# Patient Record
Sex: Female | Born: 1975 | Race: White | Hispanic: No | Marital: Married | Smoking: Current every day smoker
Health system: Southern US, Community
[De-identification: ages and names within clinical notes are randomized; demographics above are authoritative.]

## PROBLEM LIST (undated history)

## (undated) DIAGNOSIS — E785 Hyperlipidemia, unspecified: Secondary | ICD-10-CM

## (undated) HISTORY — DX: Hyperlipidemia, unspecified: E78.5

## (undated) HISTORY — PX: HAND SURGERY: SHX662

---

## 1998-01-31 ENCOUNTER — Inpatient Hospital Stay (HOSPITAL_COMMUNITY): Admission: AD | Admit: 1998-01-31 | Discharge: 1998-01-31 | Payer: Self-pay | Admitting: Obstetrics and Gynecology

## 1998-03-07 ENCOUNTER — Inpatient Hospital Stay (HOSPITAL_COMMUNITY): Admission: AD | Admit: 1998-03-07 | Discharge: 1998-03-10 | Payer: Self-pay | Admitting: Obstetrics and Gynecology

## 1999-06-15 ENCOUNTER — Other Ambulatory Visit: Admission: RE | Admit: 1999-06-15 | Discharge: 1999-06-15 | Payer: Self-pay | Admitting: Obstetrics and Gynecology

## 2000-07-23 ENCOUNTER — Other Ambulatory Visit: Admission: RE | Admit: 2000-07-23 | Discharge: 2000-07-23 | Payer: Self-pay | Admitting: Obstetrics and Gynecology

## 2002-10-16 ENCOUNTER — Other Ambulatory Visit: Admission: RE | Admit: 2002-10-16 | Discharge: 2002-10-16 | Payer: Self-pay | Admitting: Obstetrics and Gynecology

## 2004-03-16 ENCOUNTER — Other Ambulatory Visit: Admission: RE | Admit: 2004-03-16 | Discharge: 2004-03-16 | Payer: Self-pay | Admitting: Obstetrics and Gynecology

## 2004-09-12 ENCOUNTER — Other Ambulatory Visit: Admission: RE | Admit: 2004-09-12 | Discharge: 2004-09-12 | Payer: Self-pay | Admitting: Obstetrics and Gynecology

## 2005-08-29 ENCOUNTER — Other Ambulatory Visit: Admission: RE | Admit: 2005-08-29 | Discharge: 2005-08-29 | Payer: Self-pay | Admitting: Obstetrics and Gynecology

## 2007-04-30 ENCOUNTER — Ambulatory Visit: Payer: Self-pay | Admitting: Internal Medicine

## 2008-12-14 ENCOUNTER — Inpatient Hospital Stay (HOSPITAL_COMMUNITY): Admission: AD | Admit: 2008-12-14 | Discharge: 2008-12-16 | Payer: Self-pay | Admitting: Obstetrics and Gynecology

## 2008-12-15 ENCOUNTER — Encounter (INDEPENDENT_AMBULATORY_CARE_PROVIDER_SITE_OTHER): Payer: Self-pay | Admitting: Obstetrics and Gynecology

## 2009-06-28 ENCOUNTER — Ambulatory Visit: Payer: Self-pay

## 2010-09-18 LAB — COMPREHENSIVE METABOLIC PANEL
ALT: 26 U/L (ref 0–35)
AST: 28 U/L (ref 0–37)
CO2: 26 mEq/L (ref 19–32)
Chloride: 108 mEq/L (ref 96–112)
Creatinine, Ser: 0.67 mg/dL (ref 0.4–1.2)
GFR calc Af Amer: >60 mL/min (ref >60)
GFR calc non Af Amer: >60 mL/min (ref >60)
Glucose, Bld: 66 mg/dL — ABNORMAL LOW (ref 70–99)
Total Bilirubin: 0.4 mg/dL (ref 0.3–1.2)

## 2010-09-18 LAB — CBC
HCT: 38 % (ref 36.0–46.0)
Hemoglobin: 11.6 g/dL — ABNORMAL LOW (ref 12.0–15.0)
Hemoglobin: 13.3 g/dL (ref 12.0–15.0)
MCHC: 35 g/dL (ref 30.0–36.0)
MCV: 90.7 fL (ref 78.0–100.0)
RBC: 3.66 MIL/uL — ABNORMAL LOW (ref 3.87–5.11)
RBC: 4.2 MIL/uL (ref 3.87–5.11)
RDW: 14.3 % (ref 11.5–15.5)
WBC: 10.1 10*3/uL (ref 4.0–10.5)
WBC: 9.7 10*3/uL (ref 4.0–10.5)

## 2010-09-18 LAB — RH IMMUNE GLOB WKUP(>/=20WKS)(NOT WOMEN'S HOSP)

## 2011-01-26 ENCOUNTER — Ambulatory Visit: Payer: Self-pay | Admitting: Specialist

## 2011-01-26 DIAGNOSIS — I10 Essential (primary) hypertension: Secondary | ICD-10-CM

## 2012-10-28 LAB — CBC
HCT: 35.9 % (ref 35.0–47.0)
HGB: 12.2 g/dL (ref 12.0–16.0)
MCH: 30 pg (ref 26.0–34.0)
MCV: 88 fL (ref 80–100)
Platelet: 262 10*3/uL (ref 150–440)
RDW: 12.4 % (ref 11.5–14.5)
WBC: 8.2 10*3/uL (ref 3.6–11.0)

## 2012-10-28 LAB — COMPREHENSIVE METABOLIC PANEL
Anion Gap: 13 (ref 7–16)
BUN: 13 mg/dL (ref 7–18)
Calcium, Total: 9.4 mg/dL (ref 8.5–10.1)
EGFR (Non-African Amer.): >60
Potassium: 2.9 mmol/L — ABNORMAL LOW (ref 3.5–5.1)
SGPT (ALT): 27 U/L (ref 12–78)

## 2012-10-28 LAB — URINALYSIS, COMPLETE
Bilirubin,UR: NEGATIVE
Ph: 6 (ref 4.5–8.0)
RBC,UR: NONE SEEN /HPF (ref 0–5)
Squamous Epithelial: 3

## 2012-10-28 LAB — DRUG SCREEN, URINE
Amphetamines, Ur Screen: NEGATIVE (ref ?–1000)
MDMA (Ecstasy)Ur Screen: NEGATIVE (ref ?–500)
Opiate, Ur Screen: NEGATIVE (ref ?–300)
Phencyclidine (PCP) Ur S: NEGATIVE (ref ?–25)
Tricyclic, Ur Screen: NEGATIVE (ref ?–1000)

## 2012-10-28 LAB — ACETAMINOPHEN LEVEL: Acetaminophen: <2 ug/mL

## 2012-10-28 LAB — SALICYLATE LEVEL: Salicylates, Serum: 5.6 mg/dL — ABNORMAL HIGH

## 2012-10-29 ENCOUNTER — Inpatient Hospital Stay: Payer: Self-pay | Admitting: Psychiatry

## 2012-10-29 LAB — URINALYSIS, COMPLETE
Bilirubin,UR: NEGATIVE
Glucose,UR: NEGATIVE mg/dL (ref 0–75)
Nitrite: NEGATIVE
Ph: 5 (ref 4.5–8.0)
Protein: 30
RBC,UR: 15 /HPF (ref 0–5)
Specific Gravity: 1.029 (ref 1.003–1.030)
Squamous Epithelial: 52
WBC UR: 24 /HPF (ref 0–5)

## 2012-10-30 LAB — BEHAVIORAL MEDICINE 1 PANEL
Albumin: 3.1 g/dL — ABNORMAL LOW (ref 3.4–5.0)
Alkaline Phosphatase: 59 U/L (ref 50–136)
Anion Gap: 6 — ABNORMAL LOW (ref 7–16)
BUN: 11 mg/dL (ref 7–18)
Basophil #: 0.1 10*3/uL (ref 0.0–0.1)
Basophil %: 0.8 %
Bilirubin,Total: 0.3 mg/dL (ref 0.2–1.0)
Calcium, Total: 8.8 mg/dL (ref 8.5–10.1)
Chloride: 106 mmol/L (ref 98–107)
Co2: 25 mmol/L (ref 21–32)
Creatinine: 0.63 mg/dL (ref 0.60–1.30)
EGFR (African American): >60
EGFR (Non-African Amer.): >60
Eosinophil #: 0.1 10*3/uL (ref 0.0–0.7)
Eosinophil %: 1.8 %
Glucose: 88 mg/dL (ref 65–99)
HCT: 36 % (ref 35.0–47.0)
HGB: 12.1 g/dL (ref 12.0–16.0)
Lymphocyte #: 2 10*3/uL (ref 1.0–3.6)
Lymphocyte %: 24.5 %
MCH: 29.8 pg (ref 26.0–34.0)
MCHC: 33.6 g/dL (ref 32.0–36.0)
MCV: 89 fL (ref 80–100)
Monocyte #: 0.5 x10 3/mm (ref 0.2–0.9)
Monocyte %: 6.5 %
Neutrophil #: 5.3 10*3/uL (ref 1.4–6.5)
Neutrophil %: 66.4 %
Osmolality: 273 (ref 275–301)
Platelet: 259 10*3/uL (ref 150–440)
Potassium: 3.8 mmol/L (ref 3.5–5.1)
RBC: 4.05 10*6/uL (ref 3.80–5.20)
RDW: 12.3 % (ref 11.5–14.5)
SGOT(AST): 25 U/L (ref 15–37)
SGPT (ALT): 20 U/L (ref 12–78)
Sodium: 137 mmol/L (ref 136–145)
Thyroid Stimulating Horm: 4.23 u[IU]/mL
Total Protein: 6.4 g/dL (ref 6.4–8.2)
WBC: 8 10*3/uL (ref 3.6–11.0)

## 2012-10-31 LAB — TSH: Thyroid Stimulating Horm: 6.25 u[IU]/mL — ABNORMAL HIGH

## 2014-10-02 NOTE — H&P (Signed)
PATIENT NAME:  Carolyn Schroeder, Carolyn Schroeder MR#:  [id number] DATE OF BIRTH:  [birth year]  DATE OF ADMISSION:  10/29/2012  IDENTIFYING INFORMATION AND CHIEF COMPLAINT: A 39 year old woman with a history of bipolar disorder who was admitted to the hospital after taking an overdose of Seroquel.   CHIEF COMPLAINT: "I took too many pills."   HISTORY OF PRESENT ILLNESS: The patient reports on the night presentation, she got into a loud argument with her husband. They had been arguing for the last couple of weeks because he had discovered her making phone calls or text messages to another man. The patient admits that this was an inappropriate relationship, although she denies that she was actually having an affair with the person. In any case, she and her husband and squabbling about it ever since then and she says that she just got sick of it and took 6 of her 400 mg Seroquel tablets. She did it impulsively and immediately afterwards called 911 and cooperated with treatment. She says she does not think she ever really want to die but just wanted "to get attention." She admits that she had been drinking at the time. Denies that she had been abusing other drugs. She says that prior to that, she had not been having particular problems with depression. Sleep is good. Appetite is good and not having any hallucinations. Not having any signs of mania.   PAST PSYCHIATRIC HISTORY: She sees Dr. Emerson MonteParrish McKinney in [place] and has been seeing her for about three years. That is her only psychiatric treatment. She is currently taking Seroquel 400 mg at night. Not taking any other antipsychotic or antidepressant medicines. She says that she has been diagnosed with bipolar. She does not describe a full type I, manic episode. She thinks that Seroquel has been somewhat helpful for her. Never been in a psychiatric hospitalization. Denies any history of suicide attempts or self-injury or violent behavior. Denies any psychotic history.    SUBSTANCE ABUSE HISTORY: She says that she drinks about three nights a week, and will not tell me, despite my asking several times, how much she drinks on a typical night. She claims that the night she came in here had been an unusually larger amount of drinking for her. She does not think that drinking is caused her any problems. She claims that no one else in the upper suggested that either. Denies that she uses any other substances.   SOCIAL HISTORY: Lives with her husband and two children, one age 37, one much younger a toddler.  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 She and her husband been married for about 15 years.   PAST MEDICAL HISTORY: She says she has high blood pressure and takes hydrochlorothiazide and a potassium supplement. Does not know of any other ongoing medical problems.   MEDICATIONS ON ADMISSION: Seroquel 400 mg at night, hydrochlorothiazide 25 mg in the morning, potassium chloride 20 mEq per day.   ALLERGIES: PENICILLIN.   REVIEW OF SYSTEMS: As noted above, depressed mood, only last night. Not having consistently depressed mood. Denies suicidal or homicidal ideation. Denies hallucinations. Denies psychotic symptoms. Denies any current physical symptoms or complaints.   MENTAL STATUS EXAMINATION: Casually dressed woman, looks her stated age or younger. Cooperative with the interview, although somewhat passive and very fixed on wanting to be discharged. Eye contact, normal. Psychomotor activity a little sluggish. Speech decreased in total amount. Part of this may be just evading questions.  Easy to understand when she does speak. Affect seems a little bit blunted. Mood stated as being bad because she is in the hospital. Thoughts appear to be lucid with no evidence of loosening of associations or delusions. Denies auditory or visual hallucinations. Denies suicidal or homicidal ideation. Shows some impairment in her insight  regarding substance abuse recent impairment in judgment. Intelligence normal.   PHYSICAL EXAMINATION: GENERAL: Overweight woman does not appear to be in any acute distress. Her skin flushes very easily but there are no acute lesions identified. Both eyes are bloodshot, but otherwise unremarkable.  HEENT: Pupils equal and reactive. Face otherwise symmetric. Oral mucosa dry. Neck normal flexion. Back nontender. Strength and reflexes normal and symmetric throughout. Cranial nerves symmetric and normal.  LUNGS: Clear without any wheezes.  HEART: Regular rate and rhythm.  ABDOMEN: Soft, nontender, normal bowel sounds.  NEUROLOGIC:  Gait within normal limits.  VITAL SIGNS: Temperature 98.3, pulse 80, respirations 20, blood pressure 157/89.   LABORATORY RESULTS: She had an alcohol level of 243 on presentation. She had sinus tachycardia. Chemistry showed an elevated glucose and a low potassium at 2.9. TSH elevated at 7.2. Drug screen negative. Urinalysis probably positive now for an infection. CBC normal.   ASSESSMENT: A 39 year old woman with an acute episode of self-injuring behavior, but denies ongoing suicidality. She has been treated for bipolar disorder in the past, but vague in describing symptoms of it. She presents herself as not really being interested in getting treatment here and trying to minimize the seriousness of the situation. Currently, she is denying any psychotic or mood symptoms. The patient clearly has episodes of drinking excessively. It is not clear if she has alcohol dependence. May have a thyroid problem. Does have hypertension.   TREATMENT PLAN: I am going to restart the Seroquel, also her blood pressure medicine and potassium supplement. Treat the urinary tract infection. Engage her in groups and activities on the ward. Try and engage her mood and behavior. Do some more psychoeducation about her condition and med use. Psychoeducation about substance abuse.   DIAGNOSIS, PRINCIPAL  AND PRIMARY:  AXIS I: Bipolar disorder, not otherwise specified.   SECONDARY DIAGNOSES: AXIS I: Alcohol abuse.   AXIS II: Deferred.   AXIS III: Obesity, rule out hypothyroidism, positive hypertension.   AXIS IV: Severe from acute argument with her husband.   AXIS V: Functioning at time of evaluation 42.    ____________________________ Audery Amel, MD jtc:cc D: 10/30/2012 16:02:00 ET T: 10/30/2012 16:25:33 ET JOB#: [id number]  cc: Audery Amel, MD, <Dictator> Audery Amel MD ELECTRONICALLY SIGNED 10/31/2012 11:04

## 2014-10-02 NOTE — Consult Note (Signed)
Brief Consult Note: Diagnosis: Bipolar DO, Alcohol Abuse.   Patient was seen by consultant.   Consult note dictated.   Recommend further assessment or treatment.   Orders entered.   Comments: Pt seen in ED. She is s/p Seroquel over dose and appeared very drowsy and has some facial edema. She stated that she was fighting with her husband as he recently found out about her extra marital affair for the past year. She is not feeling guilty about the same. Has h/o Bipolar and takes medications.   Plan; She is IVC.  Case discussed with Dr Margarita GrizzleWoodruff and once she is medically stable, [facility]  Start Lorazepam 1mg  q6 hrs. Thiamine 100mg  poq daily and Folate 1 mg po daily.  Will continue to monitor.  Electronic Signatures: Rhunette CroftFaheem, Madiline Saffran S (MD)  (Signed 20-May-14 13:25)  Authored: Brief Consult Note   Last Updated: 20-May-14 13:25 by Rhunette CroftFaheem, Abrar Bilton S (MD)

## 2014-10-02 NOTE — Discharge Summary (Signed)
PATIENT NAME:  Carolyn Schroeder, Pluma L MR#:  [id number] DATE OF BIRTH:  [birth year]  DATE OF ADMISSION:  10/29/2012 DATE OF DISCHARGE:   10/31/2012  HOSPITAL COURSE: See dictated history and physical for details of admission. This 39 year old woman was brought to the Emergency Room after taking an impulsive overdose of Seroquel. The overdose itself was nonfatal and has passed without any adverse effects. The patient was intoxicated at the time of admission. She is now sober and not showing any signs of alcohol withdrawal. The patient appears to have more of an alcohol use problem than she had realized. She has been counseled about the importance of staying sober, as well as staying involved in her mental health treatment. She consistently denies any suicidal ideation in the hospital and has not shown any further dangerous behavior. She shows adequate insight and judgment and already has outpatient treatment in place. From a medical standpoint, one issue that arose was the discovery that she had a urinary tract infection, which has been treated. Also of note was an elevation of her TSH. Repeat testing still shows an elevation of the TSH, but not as bad as before. The patient has been educated about the importance of following up with an outpatient provider about her thyroid.   DISCHARGE MEDICATIONS: Oral birth control pill once a day, quetiapine 400 mg at night, hydrochlorothiazide 25 mg once a day, potassium chloride 20 mEq a day, Septra DS once every 12 hours for another 3 days.   LABORATORY RESULTS: Admission labs showed drug screen that was negative, TSH elevated at 7.2. Alcohol 243. Potassium low at 2.9, glucose elevated at 131, CO2 low at 20. CBC unremarkable. Urinalysis positive for signs of infection. Followup urinalysis the next day was actually even more positive. Followup chemistries most remarkable for showing not only an improvement in her potassium, but her TSH to be still slightly elevated at 6.25.    MENTAL STATUS EXAMINATION AT DISCHARGE: Neatly dressed and groomed woman, looks her stated age, cooperative with the interview. Good eye contact, normal psychomotor activity. Speech normal in rate, tone and volume. Affect euthymic, reactive and appropriate. Mood stated as being good. Thoughts are lucid. No evidence of loosening of associations or delusions. Denies auditory or visual hallucinations. Shows improved insight and judgment. Normal intelligence. Agreeable to outpatient treatment plan.   DISPOSITION: Discharged back home. She states she feels safe and comfortable returning home with her husband and children. Follow up with Dr. Andee PolesParish McKinney in [place].   DIAGNOSIS, PRINCIPAL AND PRIMARY:  AXIS I: Bipolar disorder type II.   SECONDARY DIAGNOSES: AXIS I: Alcohol abuse versus dependence.  AXIS II: Deferred.  AXIS III: Urinary tract infection, rule out hypothyroidism.  AXIS IV: Moderate to severe stress from conflict at home.  AXIS V: Functioning at time of discharge 57.   ____________________________ Audery AmelJohn T. Clapacs, MD jtc:dmm D: 10/31/2012 12:31:47 ET T: 10/31/2012 12:52:03 ET JOB#: [id number]  cc: Audery AmelJohn T. Clapacs, MD, <Dictator> Audery AmelJOHN T CLAPACS MD ELECTRONICALLY SIGNED 11/01/2012 19:19

## 2014-10-02 NOTE — Consult Note (Signed)
PATIENT NAME:  Carolyn Schroeder, Carolyn Schroeder MR#:  [id number] DATE OF BIRTH:  1976/04/08  DATE OF CONSULTATION:  10/29/2012  REFERRING PHYSICIAN:  Pollie Friar, MD   CONSULTING PHYSICIAN:  Cordelia Pen. Gretel Acre, MD  REASON FOR CONSULT: Overdose on Seroquel.   HISTORY OF PRESENT ILLNESS: The patient is a 39 year old married Caucasian female who  presented to the ED by the police after she took an overdose of 400 mg tablets of Seroquel in a suicide attempt. "I want to die." According to the initial notes, the patient was having a conflict with her husband after her husband recently found out that she was having an extramarital affair for the past 1 year. Her husband was out of town, and her daughter found out from her text message that she was talking to somebody else over the phone. The patient reported that she met him at the grocery store, and she has been going back and forth to the grocery store to see that person. She reported that she has not had any sexual contact with him. The patient stated that her daughter told her husband about the text message, and after he came back from his visit out of town he confronted her. They started fighting, and then she decided to take an overdose of the medication. She stated that she took all the pills. The patient reported that she has been feeling depressed and sad and started having mood swings. The patient was prescribed medication by her psychiatrist with whom she has been following on a regular basis. The patient was very sedated during the interview and was noted to have some facial edema as well. She reported that she is having a conflictual relationship with her husband at this time. Her husband and minister were present at the initial assessment at the ED.  The patient at this time contracted for safety.   PAST PSYCHIATRIC HISTORY: The patient reported that she was diagnosed with bipolar disorder 3 years ago by Dr. Caprice Beaver. She has been following him on a regular basis,  and he has prescribed her Seroquel 400 mg at bedtime. She continues to have mood swings. She reported that this is her first suicide attempt. She has never been hospitalized in the past.   FAMILY HISTORY: The patient reported that she has a family history of bipolar in her mother, who has also attempted suicide in the past.   CURRENT MEDICATIONS: Quetiapine 400 mg at bedtime, hydrochlorothiazide 25 mg daily, Klor-Con 20 mEq extended release 1 tablet daily  ALLERGIES: PENICILLIN.    PAST MEDICAL HISTORY: Hypertension, a recent cold.   PAST SURGICAL HISTORY: None.   SOCIAL HISTORY: The patient reported that she is currently married for the past 15 years. She has 2 children, a 40 year old daughter and a 52-year-old old son.  Her husband works at a [employer]. The patient stated that she has been seeing the other person for the past 1 year. She currently denied any pending legal charges.   SUBSTANCE ABUSE HISTORY: The patient reported that she drinks every now and then. She smokes 1 pack of cigarettes per day. She denied any use of drugs, including cocaine and marijuana, at this time.   VITAL SIGNS: Temperature not noted, pulse 106, respirations 18, blood pressure 120/66.   LABORATORY DATA: Glucose 131, BUN 13, creatinine 0.64, sodium 138, potassium 2.9, chloride 105, bicarbonate 20, GFR 60, anion gap 13, osmolality 278, calcium 9.4. Blood alcohol level 243.  Protein 7.4, albumin 3.8, bilirubin 0.2, alkaline phosphatase 63,  AST 27, ALT 27, TSH 7.2. Urine drug screen negative. WBC 8.2, RBC 4.06, hemoglobin 12.2, hematocrit 35.9, platelet count 262, MCV 88, RDW 12.4. Pregnancy test was negative.   MENTAL STATUS EXAMINATION: The patient is a moderately-built female who appeared very sedated during the interview. She has some facial swelling also. She maintained poor eye contact. Her mood was depressed.  Affect was congruent. Thought process was logical, goal-directed. Thought content was non  delusional. She was unable to keep her eyes open at this time.  She was unable to contract for safety as well. She demonstrated poor insight and judgment.   DIAGNOSTIC IMPRESSION: AXIS I: 1.  Bipolar disorder, most recent episode depressed.  2.  Alcohol abuse.  AXIS II: Personality disorder, not otherwise specified.  AXIS III: Hypertension.   TREATMENT PLAN: 1.  The patient is currently under involuntary commitment, and she is not medically stable at this time.  2.  Once she becomes medically stable, she will be admitted to the behavioral health inpatient unit.  The patient will be given lorazepam 1 mg p.o. q. 6 hours on a regular basis to help with alcohol withdrawal.  3.  She will also be given thiamine 100 mg p.o. daily.  4.  She will also be given folic acid 1 mg p.o. daily.  I discussed her care with the behavioral health staff, and they demonstrated understanding. The patient will be monitored closely in the ED at this time.   Thank you for allowing me to participate in the care of this patient.   ____________________________ Cordelia Pen. Gretel Acre, MD usf:cb D: 10/29/2012 13:50:39 ET T: 10/29/2012 14:16:09 ET JOB#: [id number]  cc: Cordelia Pen. Gretel Acre, MD, <Dictator> Jeronimo Norma MD ELECTRONICALLY SIGNED 10/31/2012 10:50

## 2018-06-22 ENCOUNTER — Inpatient Hospital Stay
Admission: EM | Admit: 2018-06-22 | Discharge: 2018-06-24 | DRG: 558 | Disposition: A | Discharge status: Home / Self Care | Payer: BLUE CROSS/BLUE SHIELD | Attending: Orthopedic Surgery | Admitting: Orthopedic Surgery

## 2018-06-22 ENCOUNTER — Other Ambulatory Visit: Payer: Self-pay

## 2018-06-22 ENCOUNTER — Emergency Department: Payer: BLUE CROSS/BLUE SHIELD

## 2018-06-22 ENCOUNTER — Encounter: Payer: Self-pay | Admitting: Emergency Medicine

## 2018-06-22 DIAGNOSIS — M659 Synovitis and tenosynovitis, unspecified: Secondary | ICD-10-CM | POA: Diagnosis present

## 2018-06-22 DIAGNOSIS — M65842 Other synovitis and tenosynovitis, left hand: Principal | ICD-10-CM | POA: Diagnosis present

## 2018-06-22 DIAGNOSIS — Z880 Allergy status to penicillin: Secondary | ICD-10-CM | POA: Diagnosis not present

## 2018-06-22 DIAGNOSIS — F17290 Nicotine dependence, other tobacco product, uncomplicated: Secondary | ICD-10-CM | POA: Diagnosis present

## 2018-06-22 DIAGNOSIS — I10 Essential (primary) hypertension: Secondary | ICD-10-CM | POA: Diagnosis present

## 2018-06-22 LAB — BASIC METABOLIC PANEL
ANION GAP: 7 (ref 5–15)
BUN: 12 mg/dL (ref 6–20)
CO2: 27 mmol/L (ref 22–32)
Calcium: 8.9 mg/dL (ref 8.9–10.3)
Chloride: 105 mmol/L (ref 98–111)
Creatinine, Ser: 0.6 mg/dL (ref 0.44–1.00)
GFR calc Af Amer: >60 mL/min (ref >60)
GFR calc non Af Amer: >60 mL/min (ref >60)
GLUCOSE: 94 mg/dL (ref 70–99)
POTASSIUM: 4 mmol/L (ref 3.5–5.1)
Sodium: 139 mmol/L (ref 135–145)

## 2018-06-22 LAB — CBC WITH DIFFERENTIAL/PLATELET
ABS IMMATURE GRANULOCYTES: 0.06 10*3/uL (ref 0.00–0.07)
BASOS PCT: 1 %
Basophils Absolute: 0.1 10*3/uL (ref 0.0–0.1)
Eosinophils Absolute: 0.3 10*3/uL (ref 0.0–0.5)
Eosinophils Relative: 2 %
HCT: 41.6 % (ref 36.0–46.0)
Hemoglobin: 13.4 g/dL (ref 12.0–15.0)
Immature Granulocytes: 0 %
Lymphocytes Relative: 24 %
Lymphs Abs: 3.4 10*3/uL (ref 0.7–4.0)
MCH: 29.6 pg (ref 26.0–34.0)
MCHC: 32.2 g/dL (ref 30.0–36.0)
MCV: 92 fL (ref 80.0–100.0)
MONO ABS: 0.9 10*3/uL (ref 0.1–1.0)
Monocytes Relative: 7 %
NEUTROS ABS: 9.2 10*3/uL — AB (ref 1.7–7.7)
Neutrophils Relative %: 66 %
Platelets: 367 10*3/uL (ref 150–400)
RBC: 4.52 MIL/uL (ref 3.87–5.11)
RDW: 12.3 % (ref 11.5–15.5)
WBC: 14 10*3/uL — AB (ref 4.0–10.5)
nRBC: 0 % (ref 0.0–0.2)

## 2018-06-22 LAB — CG4 I-STAT (LACTIC ACID): Lactic Acid, Venous: 0.61 mmol/L (ref 0.5–1.9)

## 2018-06-22 LAB — PROTIME-INR
INR: 0.88
Prothrombin Time: 11.9 seconds (ref 11.4–15.2)

## 2018-06-22 LAB — SEDIMENTATION RATE: Sed Rate: 4 mm/hr (ref 0–20)

## 2018-06-22 LAB — APTT: aPTT: 29 seconds (ref 24–36)

## 2018-06-22 MED ORDER — SODIUM CHLORIDE 0.9 % IV SOLN
INTRAVENOUS | Status: DC
  Administered 2018-06-22 – 2018-06-24 (×3): via INTRAVENOUS

## 2018-06-22 MED ORDER — ATORVASTATIN CALCIUM 10 MG PO TABS
10.0000 mg | ORAL_TABLET | Freq: Every day | ORAL | Status: DC
  Administered 2018-06-23: 10 mg via ORAL
  Filled 2018-06-22: qty 1

## 2018-06-22 MED ORDER — VANCOMYCIN HCL IN DEXTROSE 750-5 MG/150ML-% IV SOLN
750.0000 mg | Freq: Two times a day (BID) | INTRAVENOUS | Status: DC
  Administered 2018-06-22 – 2018-06-23 (×3): 750 mg via INTRAVENOUS
  Filled 2018-06-22 (×5): qty 150

## 2018-06-22 MED ORDER — SODIUM CHLORIDE 0.9 % IV SOLN
1.0000 g | Freq: Once | INTRAVENOUS | Status: AC
  Administered 2018-06-22: 1 g via INTRAVENOUS
  Filled 2018-06-22: qty 10

## 2018-06-22 MED ORDER — HYDROCODONE-ACETAMINOPHEN 5-325 MG PO TABS
1.0000 | ORAL_TABLET | ORAL | Status: DC | PRN
  Administered 2018-06-22: 2 via ORAL
  Filled 2018-06-22: qty 2

## 2018-06-22 MED ORDER — KETOROLAC TROMETHAMINE 30 MG/ML IJ SOLN
15.0000 mg | Freq: Once | INTRAMUSCULAR | Status: AC
  Administered 2018-06-22: 15 mg via INTRAVENOUS
  Filled 2018-06-22: qty 1

## 2018-06-22 MED ORDER — BISACODYL 10 MG RE SUPP
10.0000 mg | Freq: Every day | RECTAL | Status: DC | PRN
  Filled 2018-06-22: qty 1

## 2018-06-22 MED ORDER — SODIUM CHLORIDE 0.9 % IV SOLN
1.0000 g | INTRAVENOUS | Status: DC
  Administered 2018-06-23: 1 g via INTRAVENOUS
  Filled 2018-06-22: qty 1
  Filled 2018-06-22: qty 10

## 2018-06-22 MED ORDER — QUETIAPINE FUMARATE ER 200 MG PO TB24
200.0000 mg | ORAL_TABLET | Freq: Every day | ORAL | Status: DC
  Administered 2018-06-22: 200 mg via ORAL
  Filled 2018-06-22 (×3): qty 1

## 2018-06-22 MED ORDER — VANCOMYCIN HCL IN DEXTROSE 1-5 GM/200ML-% IV SOLN
1000.0000 mg | Freq: Once | INTRAVENOUS | Status: AC
  Administered 2018-06-22: 1000 mg via INTRAVENOUS
  Filled 2018-06-22: qty 200

## 2018-06-22 MED ORDER — ARIPIPRAZOLE 10 MG PO TABS
10.0000 mg | ORAL_TABLET | Freq: Every day | ORAL | Status: DC
  Administered 2018-06-22 – 2018-06-23 (×2): 10 mg via ORAL
  Filled 2018-06-22 (×4): qty 1

## 2018-06-22 MED ORDER — ONDANSETRON HCL 4 MG PO TABS: 4.0000 mg | ORAL_TABLET | Freq: Four times a day (QID) | ORAL | Status: DC | PRN

## 2018-06-22 MED ORDER — SERTRALINE HCL 50 MG PO TABS
100.0000 mg | ORAL_TABLET | Freq: Every day | ORAL | Status: DC
  Administered 2018-06-22 – 2018-06-23 (×2): 100 mg via ORAL
  Filled 2018-06-22 (×2): qty 2

## 2018-06-22 MED ORDER — ONDANSETRON HCL 4 MG/2ML IJ SOLN: 4.0000 mg | Freq: Four times a day (QID) | INTRAMUSCULAR | Status: DC | PRN

## 2018-06-22 MED ORDER — MORPHINE SULFATE (PF) 2 MG/ML IV SOLN: 1.0000 mg | INTRAVENOUS | Status: DC | PRN

## 2018-06-22 MED ORDER — DOCUSATE SODIUM 100 MG PO CAPS
100.0000 mg | ORAL_CAPSULE | Freq: Two times a day (BID) | ORAL | Status: DC
  Administered 2018-06-22 – 2018-06-24 (×3): 100 mg via ORAL
  Filled 2018-06-22 (×3): qty 1

## 2018-06-22 MED ORDER — POLYETHYLENE GLYCOL 3350 17 G PO PACK
17.0000 g | PACK | Freq: Every day | ORAL | Status: DC | PRN
  Administered 2018-06-24: 17 g via ORAL
  Filled 2018-06-22: qty 1

## 2018-06-22 MED ORDER — FLEET ENEMA 7-19 GM/118ML RE ENEM: 1.0000 | ENEMA | Freq: Once | RECTAL | Status: DC | PRN

## 2018-06-22 NOTE — ED Triage Notes (Signed)
Patient c/o left hand pain  X 1 week. Patient reports pain started with third digit, and now her second digit is causing her the most pain. Patient denies injury.

## 2018-06-22 NOTE — Progress Notes (Signed)
ADMISSION NOTE:  Pt admitted to room 142. Pt alert and oriented X4. Pts skin intact. Left hand elevated on 3 pillows. Pts husband at bedside. No concerns at this time. Pts bed in lowest position call bell in reach.

## 2018-06-22 NOTE — H&P (Signed)
PREOPERATIVE H&P  Chief Complaint: Left hand swelling and pain  HPI: Carolyn Schroeder is a 43 y.o. RHD female who presents to ER with worsening left index and middle finger pain.  Patient has been followed by Dr. Nyra Capes at [facility] since September 2019 for swelling in the middle finger and treated for inflammation in her hand.  She has not responded to NSAIDs, oral steroid taper or injection.  The swelling has progressed to involve the left index finger.  Symptoms of pain, redness and swelling are rated as moderate to severe, and have been worsening.  She presented to the ER today as she was having intense pain in the index and middle fingers.  Patient is a smoker and her job is Designer, industrial/product and involved typing.   History reviewed. No pertinent past medical history. History reviewed. No pertinent surgical history. Social History   Socioeconomic History  . Marital status: Married    Spouse name: Not on file  . Number of children: Not on file  . Years of education: Not on file  . Highest education level: Not on file  Occupational History  . Not on file  Social Needs  . Financial resource strain: Not on file  . Food insecurity:    Worry: Not on file    Inability: Not on file  . Transportation needs:    Medical: Not on file    Non-medical: Not on file  Tobacco Use  . Smoking status: Current Every Day Smoker  . Smokeless tobacco: Never Used  Substance and Sexual Activity  . Alcohol use: Not Currently  . Drug use: Not on file  . Sexual activity: Not on file  Lifestyle  . Physical activity:    Days per week: Not on file    Minutes per session: Not on file  . Stress: Not on file  Relationships  . Social connections:    Talks on phone: Not on file    Gets together: Not on file    Attends religious service: Not on file    Active member of club or organization: Not on file    Attends meetings of clubs or organizations: Not on file    Relationship status: Not  on file  Other Topics Concern  . Not on file  Social History Narrative  . Not on file   No family history on file. Allergies  Allergen Reactions  . Penicillins    Prior to Admission medications   Not on File     Positive ROS: All other systems have been reviewed and were otherwise negative with the exception of those mentioned in the HPI and as above.  Physical Exam: General: Alert, no acute distress Cardiovascular: Regular rate and rhythm, no murmurs rubs or gallops.  No pedal edema Respiratory: Clear to auscultation bilaterally, no wheezes rales or rhonchi. No cyanosis, no use of accessory musculature GI: No organomegaly, abdomen is soft and non-tender nondistended with positive bowel sounds. Skin: Skin intact, no lesions within the operative field. Neurologic: Sensation intact distally Psychiatric: Patient is competent for consent with normal mood and affect Lymphatic: No cervical lymphadenopathy  MUSCULOSKELETAL: Left hand:  Patient has intact skin.  There is moderate, diffuse swelling of the index and middle fingers.  She has tenderness over the volar aspect of both digits.  She can fully actively extend the middle finger without pain.  She can flex the middle finger to MCP joint to 70 degrees and PIP joint to 90 degrees.  Both digits  are well perfused and have intact sensation to light touch.   The index finger can be passively fully extended.  Flex of MCP and PIP joints is to approximately 60 degrees.  Passive movement of both digits is mildly painful.  Assessment: Flexor tenosynovitis of index and middle finger  Plan: Patient's clinic picture is consistent with flexor tenosynovitis.  Transfer to [facility] or [facility] for hand surgery management was attempted.  Both institutions refused transfer.    I am admitting the patient for IV antibiotics. Patient will be ordered for an MRI to look for an organized abscess.  Patient will be NPO after midnight in case the patient does not respond  to antibiotics.     [name], MD   06/22/2018 10:35 AM

## 2018-06-22 NOTE — Progress Notes (Signed)
Pt stating she takes Abilify, Seroquel, Zoloft and Lipitor daily, meds not ordered here. MD Shela Nevin notified. Per MD ok for this nurse to order home meds. Meds ordered, pt notified.

## 2018-06-22 NOTE — ED Notes (Signed)
Pt at bedside , observed swelling to left hand , pt denies any known injury .

## 2018-06-22 NOTE — Progress Notes (Signed)
Pharmacy Antibiotic Note  Carolyn Schroeder is a 43 y.o. female admitted on 06/22/2018 with left hand swelling and pain.  Pharmacy has been consulted for vancomycin dosing.  Attempt to transfer to [facility] or [facility] for hand surgery management but transfer denied to both institutions. Plan to admit patient for antibiotics.  Plan: Vancomycin 750 mg IV Q 12 hrs to start tonight at 2000. Goal AUC 400-550. Expected AUC: 429 SCr used: 0.8   Height: 5\' 4"  (162.6 cm) Weight: 150 lb (68 kg) IBW/kg (Calculated) : 54.7  Temp (24hrs), Avg:97.9 F (36.6 C), Min:97.9 F (36.6 C), Max:97.9 F (36.6 C)  Recent Labs  Lab 06/22/18 0734 06/22/18 0742  WBC  --  14.0*  CREATININE  --  0.60  LATICACIDVEN 0.61  --     Estimated Creatinine Clearance: 88.5 mL/min (by C-G formula based on SCr of 0.6 mg/dL).    Allergies  Allergen Reactions  . Penicillins     Antimicrobials this admission: Vancomycin 1/11 >>  Ceftriaxone 1/11 >>   Dose adjustments this admission: NA  Microbiology results: 1/11 BCx: pending  Thank you for allowing pharmacy to be a part of this patient's care.  Pricilla Riffle, PharmD Pharmacy Resident  06/22/2018 1:19 PM
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
## 2018-06-23 ENCOUNTER — Inpatient Hospital Stay: Payer: BLUE CROSS/BLUE SHIELD

## 2018-06-23 LAB — CBC
HCT: 38.6 % (ref 36.0–46.0)
Hemoglobin: 12.2 g/dL (ref 12.0–15.0)
MCH: 29.3 pg (ref 26.0–34.0)
MCHC: 31.6 g/dL (ref 30.0–36.0)
MCV: 92.8 fL (ref 80.0–100.0)
PLATELETS: 294 10*3/uL (ref 150–400)
RBC: 4.16 MIL/uL (ref 3.87–5.11)
RDW: 12.2 % (ref 11.5–15.5)
WBC: 8.1 10*3/uL (ref 4.0–10.5)
nRBC: 0 % (ref 0.0–0.2)

## 2018-06-23 MED ORDER — QUETIAPINE FUMARATE ER 200 MG PO TB24
200.0000 mg | ORAL_TABLET | Freq: Every day | ORAL | Status: DC
  Administered 2018-06-23: 200 mg via ORAL
  Filled 2018-06-23 (×2): qty 1

## 2018-06-23 MED ORDER — GADOBUTROL 1 MMOL/ML IV SOLN
6.0000 mL | Freq: Once | INTRAVENOUS | Status: AC | PRN
  Administered 2018-06-23: 6 mL via INTRAVENOUS
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
## 2018-06-24 LAB — HIV ANTIBODY (ROUTINE TESTING W REFLEX): HIV Screen 4th Generation wRfx: NONREACTIVE

## 2018-06-24 MED ORDER — HYDROCODONE-ACETAMINOPHEN 5-325 MG PO TABS: 1.0000 | ORAL_TABLET | ORAL | 0 refills | Status: AC | PRN

## 2018-06-24 MED ORDER — COLCHICINE 0.6 MG PO TABS: 0.6000 mg | ORAL_TABLET | Freq: Two times a day (BID) | ORAL | 0 refills | Status: AC

## 2018-06-24 MED ORDER — COLCHICINE 0.6 MG PO TABS
0.6000 mg | ORAL_TABLET | Freq: Two times a day (BID) | ORAL | Status: DC
  Administered 2018-06-24: 0.6 mg via ORAL
  Filled 2018-06-24: qty 1
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
## 2018-06-24 NOTE — Progress Notes (Signed)
Dc instructions given. Verbalizes understanding. VSS, dc home.  PT ambulatory and will driver herself

## 2018-06-24 NOTE — Progress Notes (Signed)
Subjective:  Patient reports pain as mild to moderate.  Patient continues to have limited left index and middle finger motion due to swelling.  I have reviewed the MRI images and report.  Objective:   VITALS:   Vitals:   06/22/18 2355 06/23/18 0752 06/23/18 1526 06/23/18 2232  BP: 99/64 104/67 104/63 119/63  Pulse: 63 70 72 64  Resp: 18   18  Temp: 98.5 F (36.9 C) 98.8 F (37.1 C) 98.2 F (36.8 C) 98 F (36.7 C)  TempSrc: Oral Oral Oral Oral  SpO2: 99% 100% 100% 97%  Weight:      Height:        PHYSICAL EXAM: Left hand: Left index and middle finger remains swollen.  There is no significant erythema.  Patient has moderate tenderness over the volar aspects of both fingers.  She can actively flex and extend without significant pain but motion is limited due to swelling.  Her fingers are neurovascularly intact.   LABS  Results for orders placed or performed during the hospital encounter of 06/22/18 (from the past 24 hour(s))  CBC     Status: None   Collection Time: 06/23/18  1:09 PM  Result Value Ref Range   WBC 8.1 4.0 - 10.5 K/uL   RBC 4.16 3.87 - 5.11 MIL/uL   Hemoglobin 12.2 12.0 - 15.0 g/dL   HCT 59.9 77.4 - 14.2 %   MCV 92.8 80.0 - 100.0 fL   MCH 29.3 26.0 - 34.0 pg   MCHC 31.6 30.0 - 36.0 g/dL   RDW 39.5 32.0 - 23.3 %   Platelets 294 150 - 400 K/uL   nRBC 0.0 0.0 - 0.2 %    Mr Hand Left W Wo Contrast  Result Date: 06/23/2018 CLINICAL DATA:  Index and middle finger swelling. EXAM: MRI OF THE LEFT HAND WITHOUT AND WITH CONTRAST TECHNIQUE: Multiplanar, multisequence MR imaging of the left hand was performed before and after the administration of intravenous contrast. CONTRAST:  7 mL Gadavist intravenous contrast. COMPARISON:  Left hand x-rays from yesterday. FINDINGS: Bones/Joint/Cartilage Faint reactive marrow edema in the second and third proximal phalanges with preserved T1 marrow signal. No suspicious bone lesion. Small erosion of the fourth metacarpal head. No  fracture or dislocation. Normal alignment. Trace second and third metacarpal joint effusions with mild synovial enhancement. Ligaments Collateral ligaments are intact. Muscles and Tendons Prominent enhancement of the second and third flexor tendon sheaths extending from the distal metacarpal shafts to the neck of the middle phalanges. The flexor and extensor tendons are intact. No muscle edema or atrophy. Soft tissue Mild soft tissue swelling of the index and middle fingers. No fluid collection or hematoma. No soft tissue mass. IMPRESSION: 1. Flexor tenosynovitis of the index and middle finger, which could be infectious or inflammatory in etiology. No abscess. 2. Trace second and third metacarpal joint effusions with mild synovial enhancement and small erosion of the fourth metacarpal head raises the possibility of an underlying inflammatory arthropathy such as rheumatoid arthritis. Appropriate rheumatologic workup is recommended. Electronically Signed   By: Obie Dredge M.D.   On: 06/23/2018 16:14    Assessment/Plan:     Active Problems:   Flexor tenosynovitis of finger  I have discussed this patient with my partner Dr. Stephenie Acres who is a hand specialist.  We discussed the MRI results as well as the patient's clinical course.  She would like to see the patient in the office tomorrow to evaluate the patient for possible surgery.  Juanell FairlyKevin Baylynn Shifflett , MD 06/24/2018, 8:36 AM

## 2018-06-25 ENCOUNTER — Other Ambulatory Visit
Admission: RE | Admit: 2018-06-25 | Discharge: 2018-06-25 | Disposition: A | Discharge status: Home / Self Care | Payer: BLUE CROSS/BLUE SHIELD | Source: Ambulatory Visit | Attending: Orthopaedic Surgery | Admitting: Orthopaedic Surgery

## 2018-06-25 DIAGNOSIS — M65839 Other synovitis and tenosynovitis, unspecified forearm: Secondary | ICD-10-CM | POA: Diagnosis not present

## 2018-06-25 LAB — SEDIMENTATION RATE: Sed Rate: 4 mm/hr (ref 0–20)

## 2018-06-25 LAB — C-REACTIVE PROTEIN: CRP: <0.8 mg/dL (ref <1.0)

## 2018-06-25 LAB — CBC WITH DIFFERENTIAL/PLATELET
Abs Immature Granulocytes: 0.02 10*3/uL (ref 0.00–0.07)
Basophils Absolute: 0.1 10*3/uL (ref 0.0–0.1)
Basophils Relative: 0 %
EOS PCT: 1 %
Eosinophils Absolute: 0.2 10*3/uL (ref 0.0–0.5)
HEMATOCRIT: 43 % (ref 36.0–46.0)
Hemoglobin: 13.9 g/dL (ref 12.0–15.0)
Immature Granulocytes: 0 %
LYMPHS ABS: 2.4 10*3/uL (ref 0.7–4.0)
Lymphocytes Relative: 20 %
MCH: 29.4 pg (ref 26.0–34.0)
MCHC: 32.3 g/dL (ref 30.0–36.0)
MCV: 91.1 fL (ref 80.0–100.0)
Monocytes Absolute: 0.6 10*3/uL (ref 0.1–1.0)
Monocytes Relative: 5 %
Neutro Abs: 8.7 10*3/uL — ABNORMAL HIGH (ref 1.7–7.7)
Neutrophils Relative %: 74 %
Platelets: 376 10*3/uL (ref 150–400)
RBC: 4.72 MIL/uL (ref 3.87–5.11)
RDW: 12.1 % (ref 11.5–15.5)
WBC: 12 10*3/uL — ABNORMAL HIGH (ref 4.0–10.5)
nRBC: 0 % (ref 0.0–0.2)

## 2018-06-26 LAB — CYCLIC CITRUL PEPTIDE ANTIBODY, IGG/IGA: CCP Antibodies IgG/IgA: 6 units (ref 0–19)

## 2018-06-26 LAB — RHEUMATOID FACTOR: Rheumatoid fact SerPl-aCnc: <10 IU/mL (ref 0.0–13.9)

## 2018-06-27 LAB — CULTURE, BLOOD (ROUTINE X 2)
CULTURE: NO GROWTH
Culture: NO GROWTH

## 2018-09-23 ENCOUNTER — Other Ambulatory Visit: Payer: Self-pay

## 2018-09-23 ENCOUNTER — Encounter (INDEPENDENT_AMBULATORY_CARE_PROVIDER_SITE_OTHER): Payer: Self-pay | Admitting: Vascular Surgery

## 2018-09-23 ENCOUNTER — Ambulatory Visit (INDEPENDENT_AMBULATORY_CARE_PROVIDER_SITE_OTHER): Payer: BLUE CROSS/BLUE SHIELD | Admitting: Vascular Surgery

## 2018-09-23 DIAGNOSIS — M79645 Pain in left finger(s): Secondary | ICD-10-CM | POA: Diagnosis not present

## 2018-09-23 DIAGNOSIS — I7300 Raynaud's syndrome without gangrene: Secondary | ICD-10-CM | POA: Diagnosis not present

## 2018-09-23 MED ORDER — TRAMADOL HCL 50 MG PO TABS: 50.0000 mg | ORAL_TABLET | Freq: Four times a day (QID) | ORAL | 0 refills | Status: AC | PRN

## 2018-09-23 NOTE — Progress Notes (Signed)
MRN : [id number]  Carolyn Schroeder is a 43 y.o. ([birth year]) female who presents with chief complaint of No chief complaint on file. Marland Kitchen  History of Present Illness:   The patient is seen for the evaluation of painful fingers and toes associated with Raynaud's changes. The patient notes the fingers and toes turned pale and then blue and become very painful. Exposure to cold environments makes the symptoms much worse. The changes have been going on for years and seemed to be much worse lately. There is no history of trauma or repetitive injury. The patient does note some peeling of the skin of the fingers in the palms and the skin of the toes on the soles.  As a complicating factor she developed pain in the left hand specifically in the second and third fingers.  She underwent MRI evaluation of the left hand which was consistent with flexor tenosynovitis of the index and middle finger infection versus inflammatory etiology could not be excluded although no abscess was seen.  There were also some changes of the second and third metacarpal joints and some erosions of the fourth metacarpal head which raise the possibility of an inflammatory arthropathy.  Based on these findings she did undergo exploration from a palmar approach of both the second and third digits.  She notes that her cyanosis and pain of both these fingers has increased since the surgery.  She does not carry the formal diagnosis of variant Raynaud's disease or phenomena.  The patient has not been taking Norvasc.  She does smoke approximately 1 pack of cigarettes per day and does drink caffeinated beverages throughout the day.  There is no history of malignancy or autoimmune disease.  The patient denies amaurosis fugax or recent TIA symptoms. There are no recent neurological changes noted. The patient denies claudication symptoms or rest pain symptoms. The patient denies history of DVT, PE or superficial thrombophlebitis.    No  outpatient medications have been marked as taking for the 09/23/18 encounter (Appointment) with Gilda Crease, Latina Craver, MD.    No past medical history on file.  No past surgical history on file.  Social History Social History   Tobacco Use  . Smoking status: Current Every Day Smoker  . Smokeless tobacco: Never Used  Substance Use Topics  . Alcohol use: Not Currently  . Drug use: Not on file    Family History No family history on file. No family history of bleeding/clotting disorders, porphyria or autoimmune disease   Allergies  Allergen Reactions  . Penicillins      REVIEW OF SYSTEMS (Negative unless checked)  Constitutional: [] Weight loss  [] Fever  [] Chills Cardiac: [] Chest pain   [] Chest pressure   [] Palpitations   [] Shortness of breath when laying flat   [] Shortness of breath with exertion. Vascular:  [] Pain in legs with walking   [] Pain in legs at rest  [] History of DVT   [] Phlebitis   [] Swelling in legs   [] Varicose veins   [] Non-healing ulcers Pulmonary:   [] Uses home oxygen   [] Productive cough   [] Hemoptysis   [] Wheeze  [] COPD   [] Asthma Neurologic:  [] Dizziness   [] Seizures   [] History of stroke   [] History of TIA  [] Aphasia   [] Vissual changes   [] Weakness or numbness in arm   [] Weakness or numbness in leg Musculoskeletal:   [] Joint swelling   [] Joint pain   [] Low back pain Hematologic:  [] Easy bruising  [] Easy bleeding   [] Hypercoagulable state   [] Anemic Gastrointestinal:  []   Diarrhea   Vomiting  Gastroesophageal reflux/heartburn   Difficulty swallowing. Genitourinary:  Chronic kidney disease   Difficult urination  Frequent urination   Blood in urine Skin:  Rashes   Ulcers  Psychological:  History of anxiety    History of major depression.  Physical Examination  There were no vitals filed for this visit. There is no height or weight on file to calculate BMI. Gen: WD/WN, NAD Head: [place]/AT, No temporalis wasting.  Ear/Nose/Throat: Hearing  grossly intact, nares w/o erythema or drainage, poor dentition Eyes: PER, EOMI, sclera nonicteric.  Neck: Supple, no masses.  No bruit or JVD.  Pulmonary:  Good air movement, clear to auscultation bilaterally, no use of accessory muscles.  Cardiac: RRR, normal S1, S2, no Murmurs. Vascular: There is mild cyanosis with sluggish capillary refill of the digits of both hands as well as both feet.  From the palmar aspect there is a deep cyanosis particularly of the palmar surface of both the second and third fingers left hand.  There are vertical incisional scars in both the second and third fingers and then a transverse scar across the palm that nearly connects these 2 in a U-shaped appearance. Vessel Right Left  Radial Palpable Palpable  Ulnar Palpable Palpable  Brachial Palpable Palpable  Gastrointestinal: soft, non-distended. No guarding/no peritoneal signs.  Musculoskeletal: M/S 5/5 throughout except the second and third left fingers which she cannot completely flex.  + deformity left second and third fingers but no atrophy.  Neurologic: CN 2-12 intact. Pain and light touch intact in extremities.  Symmetrical.  Speech is fluent. Motor exam as listed above. Psychiatric: Judgment intact, Mood & affect appropriate for pt's clinical situation. Dermatologic: No rashes or ulcers noted.  No changes consistent with cellulitis. Lymph : No Cervical lymphadenopathy, no lichenification or skin changes of chronic lymphedema.  CBC Lab Results  Component Value Date   WBC 12.0 (H) 06/25/2018   HGB 13.9 06/25/2018   HCT 43.0 06/25/2018   MCV 91.1 06/25/2018   PLT 376 06/25/2018    BMET    Component Value Date/Time   NA 139 06/22/2018 0742   NA 137 10/30/2012 0540   K 4.0 06/22/2018 0742   K 3.8 10/30/2012 0540   CL 105 06/22/2018 0742   CL 106 10/30/2012 0540   CO2 27 06/22/2018 0742   CO2 25 10/30/2012 0540   GLUCOSE 94 06/22/2018 0742   GLUCOSE 88 10/30/2012 0540   BUN 12 06/22/2018 0742    BUN 11 10/30/2012 0540   CREATININE 0.60 06/22/2018 0742   CREATININE 0.63 10/30/2012 0540   CALCIUM 8.9 06/22/2018 0742   CALCIUM 8.8 10/30/2012 0540   GFRNONAA >60 06/22/2018 0742   GFRNONAA >60 10/30/2012 0540   GFRAA >60 06/22/2018 0742   GFRAA >60 10/30/2012 0540   CrCl cannot be calculated (Patient's most recent lab result is older than the maximum 21 days allowed.).  COAG Lab Results  Component Value Date   INR 0.88 06/22/2018    Radiology No results found.   Assessment/Plan 1. Finger pain, left Recommend:  The patient appears to have moderate to severe Raynaud's disease affecting both her hands as well as her feet.  This is likely largely due to her smoking and nicotine intake made worse by her caffeine intake.  Her recent surgery may have added a posttraumatic component to the 2 fingers that are most profoundly affected.  I have stressed with the patient the most important single thing that she can do is stop  smoking I reiterated this multiple times to impress upon her how necessary this is not controlling her disease.  I have also discussed drastically limiting her caffeine intake as well.  Lengthy discussion regarding keeping the feet and the hands warm; gloves, washing with only warm water (especially avoiding cold water immersion) and using wool socks, specifically Smartwool socks and gloves were recommended.  Possibility of using Norvasc was discussed but it was decided to hold off for now until the benefits of conservative therapy is assessed.  The patient requested that she try abstinence of tobacco and caffeine and if this dramatically improves her as I believe it will then she requested follow up PRN.  She will contact the office if her symptoms persist or worsen.   A total of 70 minutes was spent with this patient and greater than 50% was spent in counseling and coordination of care with the patient.  Discussion included the treatment options for vascular  disease including indications for surgery and intervention.  Also discussed is the appropriate timing of treatment.  In addition medical therapy was discussed.  2. Raynaud's disease without gangrene See #1    Levora DredgeGregory Schnier, MD  09/23/2018 2:34 PM

## 2020-07-19 IMAGING — MR MR [PERSON_NAME]*[PERSON_NAME]* WO/W CM
8 series · 40 of 40 positions shown · IV contrast (gadavist)
Comparison: Left hand x-rays from yesterday.

CLINICAL DATA: Index and middle finger swelling.

EXAM:
MRI OF THE LEFT HAND WITHOUT AND WITH CONTRAST
TECHNIQUE: Multiplanar, multisequence MR imaging of the left hand was performed
before and after the administration of intravenous contrast.
CONTRAST:  7 mL Gadavist intravenous contrast.

[Series 3: T1 · axial · left · 4.0mm · 0.31mm/px · z∈[-15,+115]mm · 5 of 30 slices shown (1 of 2)]
[im 1/30]
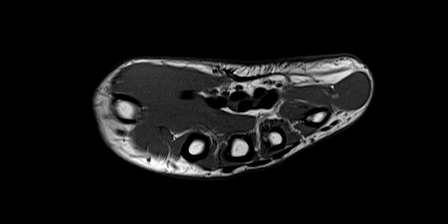
[im 8/30]
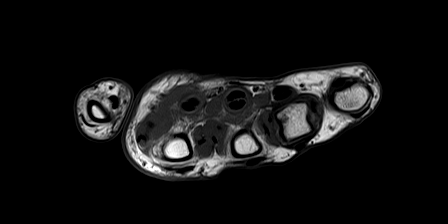
[im 15/30]
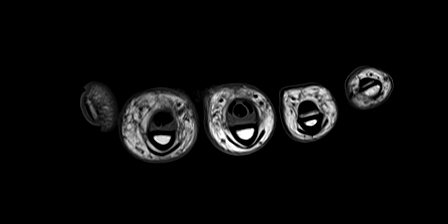
[im 22/30]
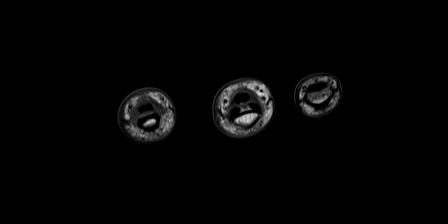
[im 30/30]
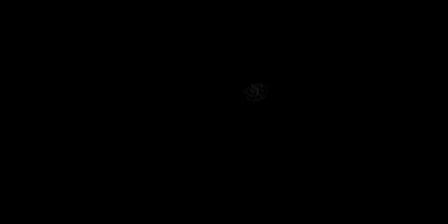

[Series 6: T1 fat-sat · axial · left · 4.0mm · 0.31mm/px · z∈[-15,+115]mm · 5 of 30 slices shown (1 of 2)]
[im 1/30]
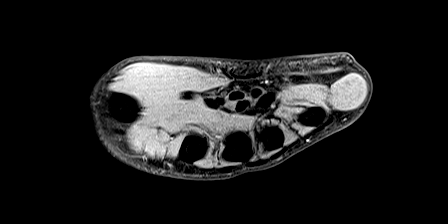
[im 8/30]
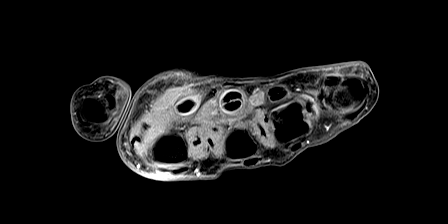
[im 15/30]
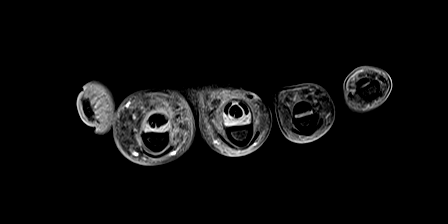
[im 22/30]
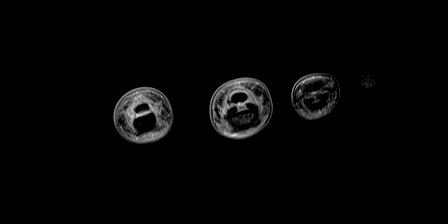
[im 30/30]
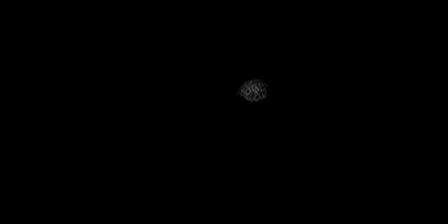

[Series 8: T2 · axial · left · 4.0mm · 0.31mm/px · z∈[-15,+115]mm · 6 of 30 slices shown]
[im 1/30]
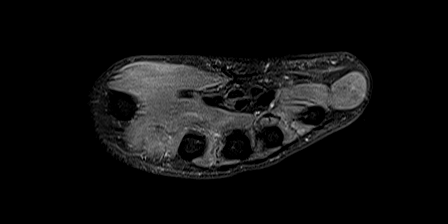
[im 6/30]
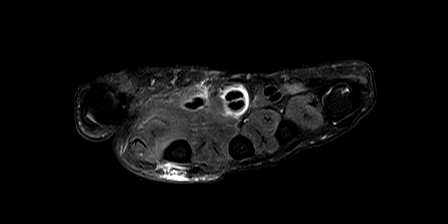
[im 12/30]
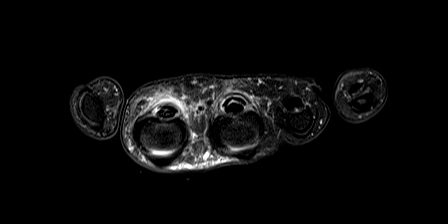
[im 18/30]
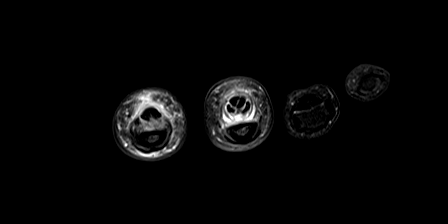
[im 24/30]
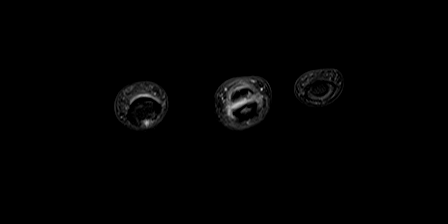
[im 30/30]
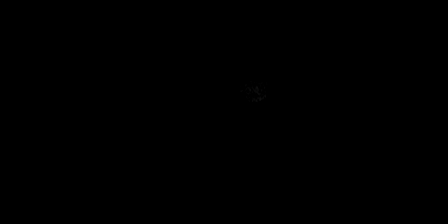

[Series 9: STIR · coronal · left · 2.0mm · 0.47mm/px · 4 of 21 slices shown]
[im 1/21]
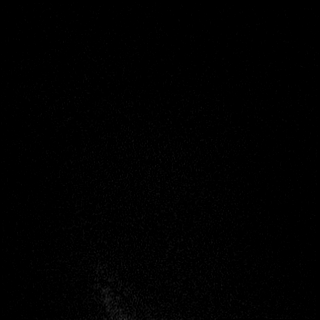
[im 7/21]
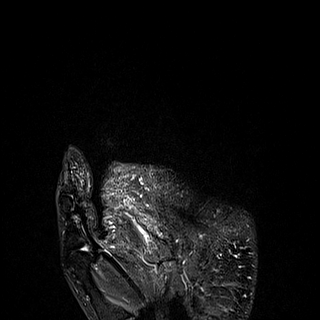
[im 14/21]
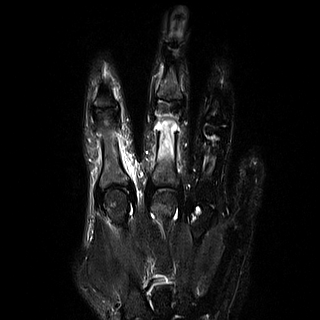
[im 21/21]
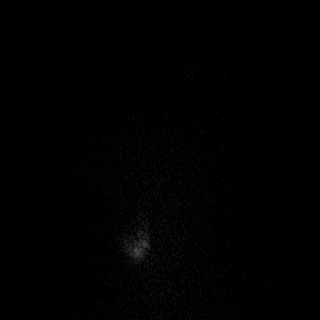

[Series 10: T1 · coronal · left · 2.0mm · 0.47mm/px · 4 of 21 slices shown (2 of 2)]
[im 1/21]
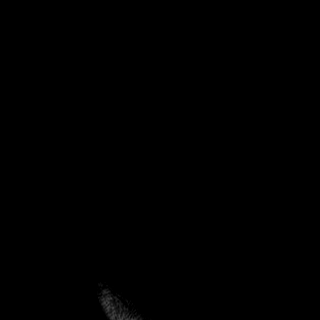
[im 7/21]
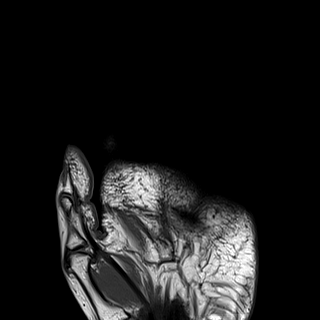
[im 14/21]
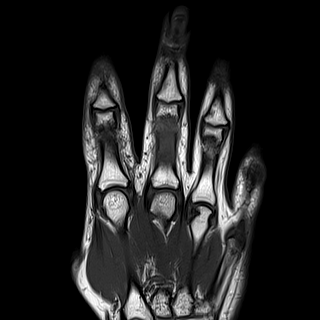
[im 21/21]
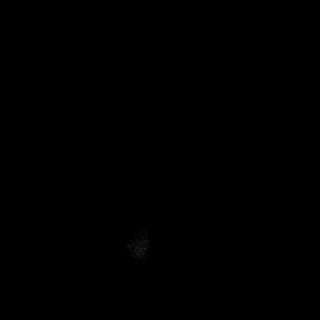

[Series 11: PD fat-sat · sagittal · left · 3.0mm · 0.33mm/px · 6 of 33 slices shown]
[im 1/33]
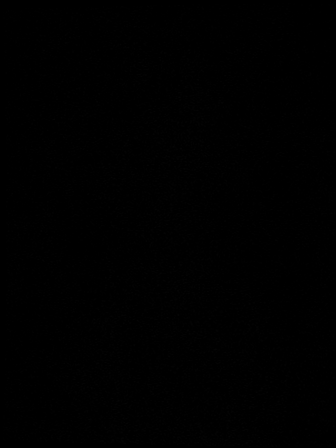
[im 7/33]
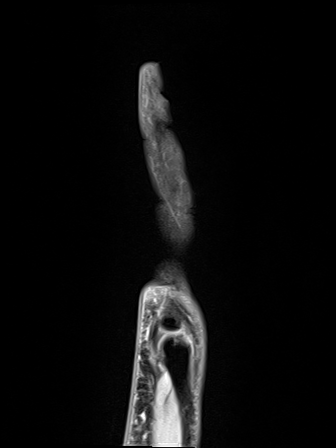
[im 13/33]
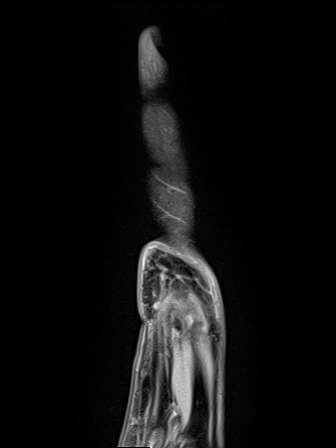
[im 20/33]
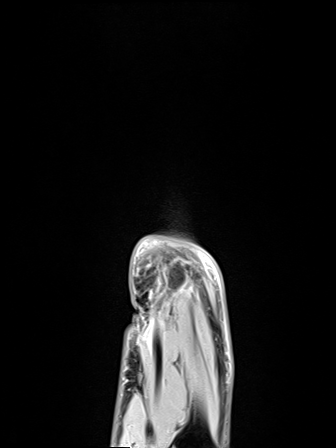
[im 26/33]
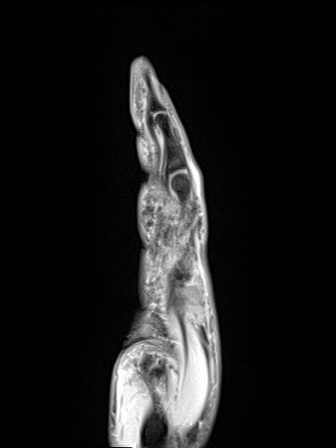
[im 33/33]
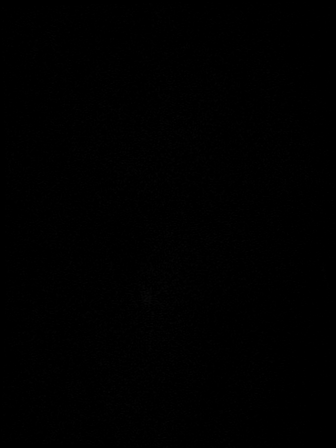

[Series 12: T1 fat-sat post-contrast · coronal · left · 2.0mm · 0.47mm/px · 4 of 21 slices shown]
[im 1/21]
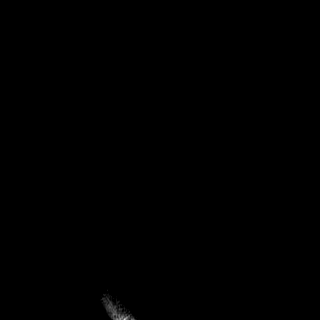
[im 7/21]
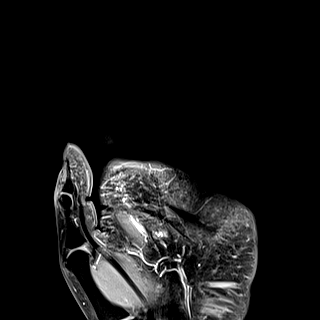
[im 14/21]
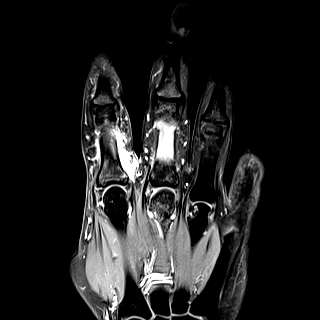
[im 21/21]
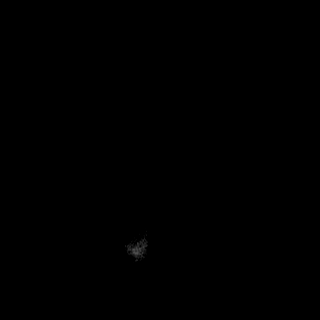

[Series 13: T1 fat-sat · axial · left · 4.0mm · 0.31mm/px · z∈[-15,+115]mm · 6 of 30 slices shown (2 of 2)]
[im 1/30]
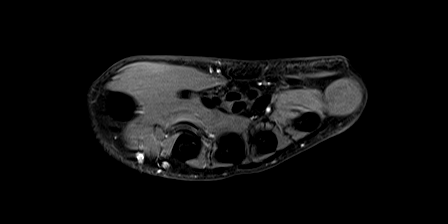
[im 6/30]
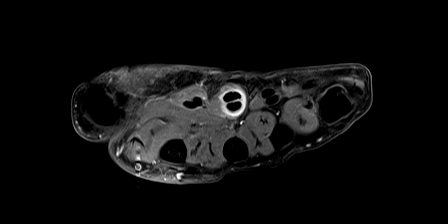
[im 12/30]
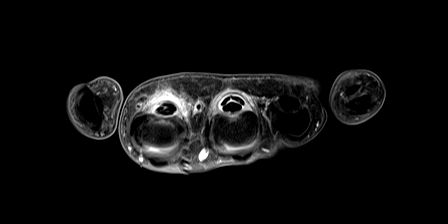
[im 18/30]
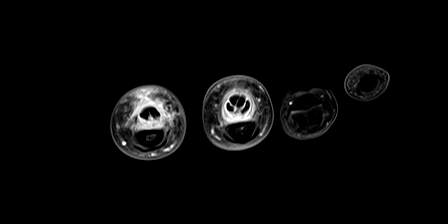
[im 24/30]
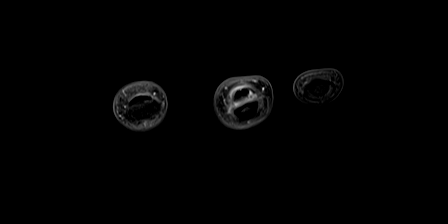
[im 30/30]
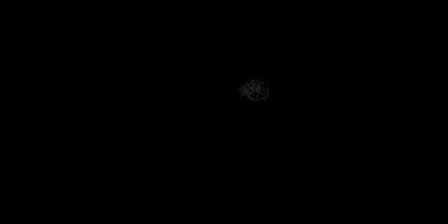

[40 of 40 positions shown; findings below may reference images not displayed]

FINDINGS: Bones/Joint/Cartilage

Faint reactive marrow edema in the second and third proximal
phalanges with preserved T1 marrow signal. No suspicious bone
lesion. Small erosion of the fourth metacarpal head. No fracture or
dislocation. Normal alignment. Trace second and third metacarpal
joint effusions with mild synovial enhancement.

Ligaments

Collateral ligaments are intact.

Muscles and Tendons
Prominent enhancement of the second and third flexor tendon sheaths
extending from the distal metacarpal shafts to the neck of the
middle phalanges. The flexor and extensor tendons are intact. No
muscle edema or atrophy.

Soft tissue
Mild soft tissue swelling of the index and middle fingers. No fluid
collection or hematoma. No soft tissue mass.
IMPRESSION: 1. Flexor tenosynovitis of the index and middle finger, which could
be infectious or inflammatory in etiology. No abscess.
2. Trace second and third metacarpal joint effusions with mild
synovial enhancement and small erosion of the fourth metacarpal head
raises the possibility of an underlying inflammatory arthropathy
such as rheumatoid arthritis. Appropriate rheumatologic workup is
recommended.
